# Patient Record
Sex: Male | Born: 1965
Health system: Southern US, Community
[De-identification: ages and names within clinical notes are randomized; demographics above are authoritative.]

## PROBLEM LIST (undated history)

## (undated) DIAGNOSIS — K200 Eosinophilic esophagitis: Secondary | ICD-10-CM

## (undated) DIAGNOSIS — J45909 Unspecified asthma, uncomplicated: Secondary | ICD-10-CM

## (undated) HISTORY — DX: Eosinophilic esophagitis: K20.0

## (undated) HISTORY — PX: WRIST SURGERY: SHX841

## (undated) HISTORY — DX: Unspecified asthma, uncomplicated: J45.909

---

## 2002-02-18 ENCOUNTER — Emergency Department (HOSPITAL_COMMUNITY): Admission: EM | Admit: 2002-02-18 | Discharge: 2002-02-19 | Payer: Self-pay | Admitting: Emergency Medicine

## 2002-02-19 ENCOUNTER — Encounter: Payer: Self-pay | Admitting: Emergency Medicine

## 2008-12-04 ENCOUNTER — Encounter: Admission: RE | Admit: 2008-12-04 | Discharge: 2008-12-04 | Payer: Self-pay | Admitting: Family Medicine

## 2010-01-31 ENCOUNTER — Encounter: Admission: RE | Admit: 2010-01-31 | Discharge: 2010-01-31 | Payer: Self-pay | Admitting: Family Medicine

## 2010-04-23 ENCOUNTER — Encounter: Admission: RE | Admit: 2010-04-23 | Discharge: 2010-04-23 | Payer: Self-pay | Admitting: Family Medicine

## 2010-04-24 ENCOUNTER — Encounter: Admission: RE | Admit: 2010-04-24 | Discharge: 2010-04-24 | Payer: Self-pay | Admitting: Family Medicine

## 2018-05-24 DIAGNOSIS — H40023 Open angle with borderline findings, high risk, bilateral: Secondary | ICD-10-CM | POA: Diagnosis not present

## 2018-05-24 DIAGNOSIS — H52223 Regular astigmatism, bilateral: Secondary | ICD-10-CM | POA: Diagnosis not present

## 2018-05-24 DIAGNOSIS — H5203 Hypermetropia, bilateral: Secondary | ICD-10-CM | POA: Diagnosis not present

## 2018-07-13 DIAGNOSIS — M62830 Muscle spasm of back: Secondary | ICD-10-CM | POA: Diagnosis not present

## 2018-07-13 DIAGNOSIS — S76012A Strain of muscle, fascia and tendon of left hip, initial encounter: Secondary | ICD-10-CM | POA: Diagnosis not present

## 2018-07-20 DIAGNOSIS — D225 Melanocytic nevi of trunk: Secondary | ICD-10-CM | POA: Diagnosis not present

## 2018-07-20 DIAGNOSIS — Z872 Personal history of diseases of the skin and subcutaneous tissue: Secondary | ICD-10-CM | POA: Diagnosis not present

## 2018-07-20 DIAGNOSIS — Z09 Encounter for follow-up examination after completed treatment for conditions other than malignant neoplasm: Secondary | ICD-10-CM | POA: Diagnosis not present

## 2018-08-26 DIAGNOSIS — H40023 Open angle with borderline findings, high risk, bilateral: Secondary | ICD-10-CM | POA: Diagnosis not present

## 2018-12-30 DIAGNOSIS — H53419 Scotoma involving central area, unspecified eye: Secondary | ICD-10-CM | POA: Diagnosis not present

## 2018-12-30 DIAGNOSIS — H40023 Open angle with borderline findings, high risk, bilateral: Secondary | ICD-10-CM | POA: Diagnosis not present

## 2019-02-23 ENCOUNTER — Emergency Department (HOSPITAL_COMMUNITY)
Admission: EM | Admit: 2019-02-23 | Discharge: 2019-02-23 | Disposition: A | Discharge status: Home / Self Care | Payer: BLUE CROSS/BLUE SHIELD | Attending: Emergency Medicine | Admitting: Emergency Medicine

## 2019-02-23 ENCOUNTER — Encounter (HOSPITAL_COMMUNITY): Payer: Self-pay

## 2019-02-23 ENCOUNTER — Emergency Department (HOSPITAL_COMMUNITY): Payer: BLUE CROSS/BLUE SHIELD

## 2019-02-23 DIAGNOSIS — S52612A Displaced fracture of left ulna styloid process, initial encounter for closed fracture: Secondary | ICD-10-CM | POA: Diagnosis not present

## 2019-02-23 DIAGNOSIS — S52572A Other intraarticular fracture of lower end of left radius, initial encounter for closed fracture: Secondary | ICD-10-CM

## 2019-02-23 DIAGNOSIS — S52515A Nondisplaced fracture of left radial styloid process, initial encounter for closed fracture: Secondary | ICD-10-CM | POA: Diagnosis not present

## 2019-02-23 DIAGNOSIS — Y9355 Activity, bike riding: Secondary | ICD-10-CM | POA: Diagnosis not present

## 2019-02-23 DIAGNOSIS — S52615A Nondisplaced fracture of left ulna styloid process, initial encounter for closed fracture: Secondary | ICD-10-CM | POA: Diagnosis not present

## 2019-02-23 DIAGNOSIS — S6992XA Unspecified injury of left wrist, hand and finger(s), initial encounter: Secondary | ICD-10-CM | POA: Diagnosis not present

## 2019-02-23 DIAGNOSIS — S52692A Other fracture of lower end of left ulna, initial encounter for closed fracture: Secondary | ICD-10-CM | POA: Diagnosis not present

## 2019-02-23 DIAGNOSIS — Y92482 Bike path as the place of occurrence of the external cause: Secondary | ICD-10-CM | POA: Insufficient documentation

## 2019-02-23 DIAGNOSIS — Y999 Unspecified external cause status: Secondary | ICD-10-CM | POA: Insufficient documentation

## 2019-02-23 DIAGNOSIS — S52592A Other fractures of lower end of left radius, initial encounter for closed fracture: Secondary | ICD-10-CM | POA: Diagnosis not present

## 2019-02-23 MED ORDER — HYDROCODONE-ACETAMINOPHEN 5-325 MG PO TABS: 1.0000 | ORAL_TABLET | Freq: Four times a day (QID) | ORAL | 0 refills | Status: DC | PRN

## 2019-02-23 NOTE — Discharge Instructions (Signed)
Call the office of Dr. Caralyn Guile in the morning to schedule follow-up tomorrow or Friday.  We recommend ibuprofen or Aleve for management of pain.  You may supplement this with Norco as needed for severe pain control.  Keep your splint on at all times.  Do not remove this and last till today so by an orthopedic hand surgeon.  Would benefit from keeping your left arm and hand elevated at or above the level of your heart to prevent increased pain and swelling.  Ice over top of your splint 3-4 times per day.  You may return to the ED for new or concerning symptoms.
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
## 2019-02-24 DIAGNOSIS — S52502A Unspecified fracture of the lower end of left radius, initial encounter for closed fracture: Secondary | ICD-10-CM | POA: Diagnosis not present

## 2019-02-28 DIAGNOSIS — S52571A Other intraarticular fracture of lower end of right radius, initial encounter for closed fracture: Secondary | ICD-10-CM | POA: Diagnosis not present

## 2019-02-28 DIAGNOSIS — S52592D Other fractures of lower end of left radius, subsequent encounter for closed fracture with routine healing: Secondary | ICD-10-CM | POA: Diagnosis not present

## 2019-03-15 DIAGNOSIS — S52502A Unspecified fracture of the lower end of left radius, initial encounter for closed fracture: Secondary | ICD-10-CM | POA: Diagnosis not present

## 2019-04-05 DIAGNOSIS — S52502A Unspecified fracture of the lower end of left radius, initial encounter for closed fracture: Secondary | ICD-10-CM | POA: Diagnosis not present

## 2019-04-05 DIAGNOSIS — M25632 Stiffness of left wrist, not elsewhere classified: Secondary | ICD-10-CM | POA: Diagnosis not present

## 2019-04-15 DIAGNOSIS — M25632 Stiffness of left wrist, not elsewhere classified: Secondary | ICD-10-CM | POA: Diagnosis not present

## 2019-04-19 DIAGNOSIS — M25632 Stiffness of left wrist, not elsewhere classified: Secondary | ICD-10-CM | POA: Diagnosis not present

## 2019-04-21 DIAGNOSIS — M25632 Stiffness of left wrist, not elsewhere classified: Secondary | ICD-10-CM | POA: Diagnosis not present

## 2019-04-26 DIAGNOSIS — S52502A Unspecified fracture of the lower end of left radius, initial encounter for closed fracture: Secondary | ICD-10-CM | POA: Diagnosis not present

## 2019-04-26 DIAGNOSIS — M25632 Stiffness of left wrist, not elsewhere classified: Secondary | ICD-10-CM | POA: Diagnosis not present

## 2019-04-28 DIAGNOSIS — M25632 Stiffness of left wrist, not elsewhere classified: Secondary | ICD-10-CM | POA: Diagnosis not present

## 2019-05-13 DIAGNOSIS — Z23 Encounter for immunization: Secondary | ICD-10-CM | POA: Diagnosis not present

## 2019-05-13 DIAGNOSIS — M25632 Stiffness of left wrist, not elsewhere classified: Secondary | ICD-10-CM | POA: Diagnosis not present

## 2019-05-18 DIAGNOSIS — M25632 Stiffness of left wrist, not elsewhere classified: Secondary | ICD-10-CM | POA: Diagnosis not present

## 2019-05-24 DIAGNOSIS — S52592D Other fractures of lower end of left radius, subsequent encounter for closed fracture with routine healing: Secondary | ICD-10-CM | POA: Diagnosis not present

## 2019-05-25 DIAGNOSIS — M25632 Stiffness of left wrist, not elsewhere classified: Secondary | ICD-10-CM | POA: Diagnosis not present

## 2019-05-30 DIAGNOSIS — H40023 Open angle with borderline findings, high risk, bilateral: Secondary | ICD-10-CM | POA: Diagnosis not present

## 2019-06-02 DIAGNOSIS — M25632 Stiffness of left wrist, not elsewhere classified: Secondary | ICD-10-CM | POA: Diagnosis not present

## 2019-06-02 DIAGNOSIS — Z0000 Encounter for general adult medical examination without abnormal findings: Secondary | ICD-10-CM | POA: Diagnosis not present

## 2019-06-07 DIAGNOSIS — Z125 Encounter for screening for malignant neoplasm of prostate: Secondary | ICD-10-CM | POA: Diagnosis not present

## 2019-06-07 DIAGNOSIS — Z8342 Family history of familial hypercholesterolemia: Secondary | ICD-10-CM | POA: Diagnosis not present

## 2019-06-20 DIAGNOSIS — M25632 Stiffness of left wrist, not elsewhere classified: Secondary | ICD-10-CM | POA: Diagnosis not present

## 2019-07-04 DIAGNOSIS — M25632 Stiffness of left wrist, not elsewhere classified: Secondary | ICD-10-CM | POA: Diagnosis not present

## 2019-07-22 DIAGNOSIS — L814 Other melanin hyperpigmentation: Secondary | ICD-10-CM | POA: Diagnosis not present

## 2019-07-22 DIAGNOSIS — D225 Melanocytic nevi of trunk: Secondary | ICD-10-CM | POA: Diagnosis not present

## 2019-07-22 DIAGNOSIS — D1801 Hemangioma of skin and subcutaneous tissue: Secondary | ICD-10-CM | POA: Diagnosis not present

## 2019-07-22 DIAGNOSIS — L821 Other seborrheic keratosis: Secondary | ICD-10-CM | POA: Diagnosis not present

## 2019-08-01 DIAGNOSIS — M25632 Stiffness of left wrist, not elsewhere classified: Secondary | ICD-10-CM | POA: Diagnosis not present

## 2019-08-05 ENCOUNTER — Other Ambulatory Visit: Payer: Self-pay

## 2019-08-05 ENCOUNTER — Emergency Department (HOSPITAL_COMMUNITY)
Admission: EM | Admit: 2019-08-05 | Discharge: 2019-08-06 | Disposition: A | Discharge status: Home / Self Care | Payer: BC Managed Care – PPO | Attending: Emergency Medicine | Admitting: Emergency Medicine

## 2019-08-05 ENCOUNTER — Encounter (HOSPITAL_COMMUNITY): Payer: Self-pay | Admitting: Emergency Medicine

## 2019-08-05 DIAGNOSIS — N200 Calculus of kidney: Secondary | ICD-10-CM

## 2019-08-05 DIAGNOSIS — R112 Nausea with vomiting, unspecified: Secondary | ICD-10-CM | POA: Diagnosis not present

## 2019-08-05 DIAGNOSIS — R911 Solitary pulmonary nodule: Secondary | ICD-10-CM | POA: Insufficient documentation

## 2019-08-05 DIAGNOSIS — R109 Unspecified abdominal pain: Secondary | ICD-10-CM | POA: Diagnosis not present

## 2019-08-05 DIAGNOSIS — R1031 Right lower quadrant pain: Secondary | ICD-10-CM | POA: Diagnosis not present

## 2019-08-05 DIAGNOSIS — R0781 Pleurodynia: Secondary | ICD-10-CM | POA: Diagnosis not present

## 2019-08-05 LAB — COMPREHENSIVE METABOLIC PANEL
ALT: 19 U/L (ref 0–44)
AST: 31 U/L (ref 15–41)
Albumin: 4.3 g/dL (ref 3.5–5.0)
Alkaline Phosphatase: 49 U/L (ref 38–126)
Anion gap: 11 (ref 5–15)
BUN: 16 mg/dL (ref 6–20)
CO2: 24 mmol/L (ref 22–32)
Calcium: 9.1 mg/dL (ref 8.9–10.3)
Chloride: 101 mmol/L (ref 98–111)
Creatinine, Ser: 1.19 mg/dL (ref 0.61–1.24)
GFR calc Af Amer: >60 mL/min (ref >60)
GFR calc non Af Amer: >60 mL/min (ref >60)
Glucose, Bld: 138 mg/dL — ABNORMAL HIGH (ref 70–99)
Potassium: 3.9 mmol/L (ref 3.5–5.1)
Sodium: 136 mmol/L (ref 135–145)
Total Bilirubin: 0.6 mg/dL (ref 0.3–1.2)
Total Protein: 6.7 g/dL (ref 6.5–8.1)

## 2019-08-05 LAB — CBC
HCT: 45.7 % (ref 39.0–52.0)
Hemoglobin: 15.2 g/dL (ref 13.0–17.0)
MCH: 30.3 pg (ref 26.0–34.0)
MCHC: 33.3 g/dL (ref 30.0–36.0)
MCV: 91 fL (ref 80.0–100.0)
Platelets: 257 10*3/uL (ref 150–400)
RBC: 5.02 MIL/uL (ref 4.22–5.81)
RDW: 12 % (ref 11.5–15.5)
WBC: 7 10*3/uL (ref 4.0–10.5)
nRBC: 0 % (ref 0.0–0.2)

## 2019-08-05 LAB — LIPASE, BLOOD: Lipase: 48 U/L (ref 11–51)

## 2019-08-05 MED ORDER — SODIUM CHLORIDE 0.9% FLUSH
3.0000 mL | Freq: Once | INTRAVENOUS | Status: AC
  Administered 2019-08-06: 3 mL via INTRAVENOUS

## 2019-08-05 NOTE — ED Triage Notes (Signed)
Patient reports RLQ pain with emesis x1 onset 7 pm this evening , no diarrhea or fever .

## 2019-08-06 ENCOUNTER — Emergency Department (HOSPITAL_COMMUNITY): Payer: BC Managed Care – PPO

## 2019-08-06 DIAGNOSIS — R109 Unspecified abdominal pain: Secondary | ICD-10-CM | POA: Diagnosis not present

## 2019-08-06 LAB — CBC WITH DIFFERENTIAL/PLATELET
Abs Immature Granulocytes: 0.03 10*3/uL (ref 0.00–0.07)
Basophils Absolute: 0 10*3/uL (ref 0.0–0.1)
Basophils Relative: 0 %
Eosinophils Absolute: 0.1 10*3/uL (ref 0.0–0.5)
Eosinophils Relative: 1 %
HCT: 43.8 % (ref 39.0–52.0)
Hemoglobin: 14.8 g/dL (ref 13.0–17.0)
Immature Granulocytes: 0 %
Lymphocytes Relative: 6 %
Lymphs Abs: 0.6 10*3/uL — ABNORMAL LOW (ref 0.7–4.0)
MCH: 30.5 pg (ref 26.0–34.0)
MCHC: 33.8 g/dL (ref 30.0–36.0)
MCV: 90.3 fL (ref 80.0–100.0)
Monocytes Absolute: 0.6 10*3/uL (ref 0.1–1.0)
Monocytes Relative: 7 %
Neutro Abs: 7.9 10*3/uL — ABNORMAL HIGH (ref 1.7–7.7)
Neutrophils Relative %: 86 %
Platelets: 234 10*3/uL (ref 150–400)
RBC: 4.85 MIL/uL (ref 4.22–5.81)
RDW: 12.1 % (ref 11.5–15.5)
WBC: 9.2 10*3/uL (ref 4.0–10.5)
nRBC: 0 % (ref 0.0–0.2)

## 2019-08-06 LAB — COMPREHENSIVE METABOLIC PANEL
ALT: 20 U/L (ref 0–44)
AST: 27 U/L (ref 15–41)
Albumin: 4.2 g/dL (ref 3.5–5.0)
Alkaline Phosphatase: 47 U/L (ref 38–126)
Anion gap: 9 (ref 5–15)
BUN: 15 mg/dL (ref 6–20)
CO2: 25 mmol/L (ref 22–32)
Calcium: 9 mg/dL (ref 8.9–10.3)
Chloride: 104 mmol/L (ref 98–111)
Creatinine, Ser: 1.2 mg/dL (ref 0.61–1.24)
GFR calc Af Amer: >60 mL/min (ref >60)
GFR calc non Af Amer: >60 mL/min (ref >60)
Glucose, Bld: 121 mg/dL — ABNORMAL HIGH (ref 70–99)
Potassium: 3.7 mmol/L (ref 3.5–5.1)
Sodium: 138 mmol/L (ref 135–145)
Total Bilirubin: 0.9 mg/dL (ref 0.3–1.2)
Total Protein: 6.5 g/dL (ref 6.5–8.1)

## 2019-08-06 LAB — URINALYSIS, ROUTINE W REFLEX MICROSCOPIC
Bacteria, UA: NONE SEEN
Bilirubin Urine: NEGATIVE
Glucose, UA: NEGATIVE mg/dL
Ketones, ur: 20 mg/dL — AB
Leukocytes,Ua: NEGATIVE
Nitrite: NEGATIVE
Protein, ur: NEGATIVE mg/dL
RBC / HPF: >50 RBC/hpf — ABNORMAL HIGH (ref 0–5)
Specific Gravity, Urine: 1.02 (ref 1.005–1.030)
pH: 6 (ref 5.0–8.0)

## 2019-08-06 MED ORDER — HYDROCODONE-ACETAMINOPHEN 5-325 MG PO TABS: 1.0000 | ORAL_TABLET | ORAL | 0 refills | Status: DC | PRN

## 2019-08-06 MED ORDER — KETOROLAC TROMETHAMINE 30 MG/ML IJ SOLN
30.0000 mg | Freq: Once | INTRAMUSCULAR | Status: AC
  Administered 2019-08-06: 11:00:00 30 mg via INTRAVENOUS
  Filled 2019-08-06: qty 1

## 2019-08-06 MED ORDER — IOHEXOL 300 MG/ML  SOLN
100.0000 mL | Freq: Once | INTRAMUSCULAR | Status: AC | PRN
  Administered 2019-08-06: 100 mL via INTRAVENOUS

## 2019-08-06 MED ORDER — ONDANSETRON HCL 4 MG/2ML IJ SOLN
4.0000 mg | Freq: Once | INTRAMUSCULAR | Status: AC
  Administered 2019-08-06: 09:00:00 4 mg via INTRAVENOUS
  Filled 2019-08-06: qty 2

## 2019-08-06 MED ORDER — ONDANSETRON 4 MG PO TBDP: 4.0000 mg | ORAL_TABLET | Freq: Three times a day (TID) | ORAL | 0 refills | Status: DC | PRN

## 2019-08-06 MED ORDER — MORPHINE SULFATE (PF) 4 MG/ML IV SOLN
4.0000 mg | Freq: Once | INTRAVENOUS | Status: AC
  Administered 2019-08-06: 4 mg via INTRAVENOUS
  Filled 2019-08-06: qty 1

## 2019-08-06 MED ORDER — SODIUM CHLORIDE 0.9 % IV BOLUS
1000.0000 mL | Freq: Once | INTRAVENOUS | Status: AC
  Administered 2019-08-06: 09:00:00 1000 mL via INTRAVENOUS

## 2019-08-06 NOTE — ED Notes (Signed)
Patient transported to CT 

## 2019-08-06 NOTE — ED Notes (Signed)
Pt ambulates with steady gait, reports family is on the way to pick him up.

## 2019-08-06 NOTE — Discharge Instructions (Addendum)
Follow-up with your primary care provider to review your results for today.  Incidental finding of pulmonary nodules while recommend repeat CT scan per guidelines.  Prescriptions for Norco and Zofran have been sent to your pharmacy, if your pain returns you may take these as prescribed, do not drive or operate machinery if taking Norco.  Return to the ER for worsening pain, fever, vomiting, symptoms not controlled by her medications.

## 2019-08-06 NOTE — ED Provider Notes (Signed)
[facility] EMERGENCY DEPARTMENT Provider Note   CSN: [id number] Arrival date & time: 08/05/19  2205     History Chief Complaint  Patient presents with  . Abdominal Pain    Keith Carlson is a 54 y.o. male.  54 year old male with no significant past medical history presents with complaint of right lower abdominal pain.  Patient states pain started under his right ribs about 13 hours ago after he finished eating dinner.  Pain was intermittent, has been constant since 630 this morning, radiating from under his ribs to right mid to lower abdominal area.  Nothing makes pain better or worse.  Pain is associated with one episode of nausea and vomiting, denies changes in bowel or bladder habits, fever, chills.  No prior abdominal surgeries.  Patient states he was feeling well yesterday until this pain started, no history of gallbladder problems or pain related to food intake.  No other complaints or concerns.        History reviewed. No pertinent past medical history.  There are no problems to display for this patient.   Past Surgical History:  Procedure Laterality Date  . WRIST SURGERY         No family history on file.  Social History   Tobacco Use  . Smoking status: Never Smoker  . Smokeless tobacco: Never Used  Substance Use Topics  . Alcohol use: Yes  . Drug use: Never    Home Medications Prior to Admission medications   Medication Sig Start Date End Date Taking? Authorizing Provider  HYDROcodone-acetaminophen (NORCO/VICODIN) 5-325 MG tablet Take 1 tablet by mouth every 4 (four) hours as needed. 08/06/19   Jeannie Fend, PA-C  ondansetron (ZOFRAN ODT) 4 MG disintegrating tablet Take 1 tablet (4 mg total) by mouth every 8 (eight) hours as needed for nausea or vomiting. 08/06/19   Jeannie Fend, PA-C    Allergies    Patient has no known allergies.  Review of Systems   Review of Systems  Constitutional: Negative for chills, diaphoresis and  fever.  Cardiovascular: Negative for chest pain.  Gastrointestinal: Positive for abdominal pain, nausea and vomiting. Negative for constipation and diarrhea.  Genitourinary: Negative for difficulty urinating, dysuria and frequency.  Musculoskeletal: Negative for arthralgias, back pain and myalgias.  Skin: Negative for rash and wound.  Allergic/Immunologic: Negative for immunocompromised state.  Neurological: Negative for weakness.  Hematological: Negative for adenopathy.  Psychiatric/Behavioral: Negative for confusion.  All other systems reviewed and are negative.   Physical Exam Updated Vital Signs BP 132/80   Pulse 64   Temp 97.6 F (36.4 C) (Oral)   Resp 16   SpO2 99%   Physical Exam Vitals and nursing note reviewed.  Constitutional:      General: He is not in acute distress.    Appearance: He is well-developed. He is not diaphoretic.  HENT:     Head: Normocephalic and atraumatic.  Cardiovascular:     Rate and Rhythm: Normal rate and regular rhythm.     Heart sounds: Normal heart sounds.  Pulmonary:     Effort: Pulmonary effort is normal.  Abdominal:     General: Abdomen is flat.     Palpations: Abdomen is soft.     Tenderness: There is abdominal tenderness in the right lower quadrant. There is guarding. There is no right CVA tenderness or left CVA tenderness. Positive signs include Rovsing's sign. Negative signs include Avah Bashor's sign, psoas sign and obturator sign.  Skin:  General: Skin is warm and dry.     Findings: No erythema or rash.  Neurological:     Mental Status: He is alert and oriented to person, place, and time.  Psychiatric:        Behavior: Behavior normal.     ED Results / Procedures / Treatments   Labs (all labs ordered are listed, but only abnormal results are displayed) Labs Reviewed  COMPREHENSIVE METABOLIC PANEL - Abnormal; Notable for the following components:      Result Value   Glucose, Bld 138 (*)    All other components within  normal limits  URINALYSIS, ROUTINE W REFLEX MICROSCOPIC - Abnormal; Notable for the following components:   APPearance HAZY (*)    Hgb urine dipstick LARGE (*)    Ketones, ur 20 (*)    RBC / HPF >50 (*)    All other components within normal limits  COMPREHENSIVE METABOLIC PANEL - Abnormal; Notable for the following components:   Glucose, Bld 121 (*)    All other components within normal limits  CBC WITH DIFFERENTIAL/PLATELET - Abnormal; Notable for the following components:   Neutro Abs 7.9 (*)    Lymphs Abs 0.6 (*)    All other components within normal limits  LIPASE, BLOOD  CBC    EKG None  Radiology CT Abdomen Pelvis W Contrast  Result Date: 08/06/2019 CLINICAL DATA:  Right lower quadrant abdominal pain. Evaluate for appendicitis. EXAM: CT ABDOMEN AND PELVIS WITH CONTRAST TECHNIQUE: Multidetector CT imaging of the abdomen and pelvis was performed using the standard protocol following bolus administration of intravenous contrast. CONTRAST:  134mL OMNIPAQUE IOHEXOL 300 MG/ML  SOLN COMPARISON:  None. FINDINGS: Lower chest: Limited visualization of the lower thorax demonstrates an approximately 0.7 cm nodule within the left lower lobe (image 4, series 4) which contains an internal calcification (coronal image 101, series 6). There is an approximately 0.7 cm noncalcified nodule within the right lower lobe (axial image 3, series 4; coronal image 69, series 6) as well as a punctate (approximately 4 mm) left lower lobe pulmonary nodule (image 99, series 6) as well as a 3 mm pulmonary nodule within the right lower lobe (image 1, series 4). Minimal dependent subpleural ground-glass atelectasis. Minimal subsegmental atelectasis within the left costophrenic angle. No discrete consolidative opacities. No air bronchograms. No pleural effusion. Normal heart size.  No pericardial effusion. Hepatobiliary: Normal hepatic contour. There is a minimal amount of focal fatty infiltration adjacent to the fissure  for ligamentum teres. No discrete hepatic lesions. Normal appearance of the gallbladder given degree distention. No radiopaque gallstones. No intra or extrahepatic biliary ductal dilatation. No ascites. Pancreas: Normal appearance of the pancreas. Spleen: Normal appearance of the spleen. Adrenals/Urinary Tract: There is a punctate (approximately 2 mm) nonobstructing stone lying dependently within the urinary bladder adjacent to the right UVJ (image 82, series 3; sagittal image 79, series 7) with associated mild residual upstream right-sided ureterectasis and pelvicaliectasis and thus favored to represent a recently passed right-sided ureteral stone. Note is made of a punctate (approximately 2 mm) nonobstructing stone within the inferior pole the right kidney (coronal image 88, series 6. No evidence of left-sided nephrolithiasis on this postcontrast examination. No evidence of left-sided urinary obstruction. Several phleboliths are seen with the lower pelvis bilaterally, left greater than right. Punctate subcentimeter bilateral renal lesions are too small to accurately characterize though favored to represent renal cysts. Normal appearance the bilateral adrenal glands. Otherwise, normal appearance of the urinary bladder given degree of distention.  Stomach/Bowel: Moderate colonic stool burden without evidence of enteric obstruction. Normal appearance of the terminal ileum. The appendix is not visualized, however there is no pericecal inflammatory change. No discrete areas of bowel wall thickening. No pneumoperitoneum, pneumatosis or portal venous gas. Vascular/Lymphatic: Normal caliber the abdominal aorta. The major branch vessels of the abdominal aorta appear patent on this non CTA examination. No bulky retroperitoneal, mesenteric, pelvic or inguinal lymph adenopathy. Reproductive: Normal appearance the prostate gland. No free fluid within the pelvic cul-de-sac. Other: Regional soft tissues appear normal.  Musculoskeletal: No acute or aggressive osseous abnormalities. Moderate to severe multilevel lumbar spine DDD, worse at L3-L4, how L4-L5 and to a lesser extent, L5-S1 with disc space height loss, endplate irregularity and sclerosis. Ex vacuo disc phenomenon is noted at multiple levels. IMPRESSION: 1. Punctate (approximately 2 mm) stone within the urinary bladder adjacent to the right UVJ with residual upstream mild right-sided ureterectasis and pelvicaliectasis, favored to represent a recently passed right-sided ureteral stone. 2. Solitary additional punctate (approximately 2 mm) nonobstructing right-sided renal stone. 3. No evidence of left sided nephrolithiasis or urinary obstruction. 4. Indeterminate pulmonary nodules within the imaged lung bases, the largest of which measures 7 mm in diameter. Comparison with prior outside examinations (if available), is advised. Otherwise, a non-contrast chest CT at 3-6 months is recommended. If the nodules are stable at time of repeat CT, then future CT at 18-24 months (from today's scan) is considered optional for low-risk patients, but is recommended for high-risk patients. This recommendation follows the consensus statement: Guidelines for Management of Incidental Pulmonary Nodules Detected on CT Images: From the Fleischner Society 2017; Radiology 2017; 284:228-243. Electronically Signed   By: Simonne Come M.D.   On: 08/06/2019 09:59    Procedures Procedures (including critical care time)  Medications Ordered in ED Medications  sodium chloride flush (NS) 0.9 % injection 3 mL (has no administration in time range)  ketorolac (TORADOL) 30 MG/ML injection 30 mg (has no administration in time range)  sodium chloride 0.9 % bolus 1,000 mL (1,000 mLs Intravenous New Bag/Given 08/06/19 0852)  ondansetron (ZOFRAN) injection 4 mg (4 mg Intravenous Given 08/06/19 0853)  morphine 4 MG/ML injection 4 mg (4 mg Intravenous Given 08/06/19 0854)  iohexol (OMNIPAQUE) 300 MG/ML solution  100 mL (100 mLs Intravenous Contrast Given 08/06/19 0930)    ED Course  I have reviewed the triage vital signs and the nursing notes.  Pertinent labs & imaging results that were available during my care of the patient were reviewed by me and considered in my medical decision making (see chart for details).  Clinical Course as of Aug 05 1029  Sat Aug 06, 2019  5841 54 year old male presents with complaint of right side abdominal pain onset around 7 PM after eating dinner.  Pain was intermittent and then became constant this morning.  On exam patient had right mid abdominal tenderness with positive Rovsing sign, negative obturator and psoas, negative Arnella Pralle.  Slight guarding.  Patient was concern for possible appendicitis due to family history of same.  Initial blood work was drawn on arrival and near onset of patient's pain, he had a an extensive wait in the lobby today, labs were repeated to evaluate for any changes.  No significant changes to CBC and CMP.  Urinalysis with large amount hemoglobin, ketones, red blood cells, suspect kidney stone.  CT abdomen and pelvis with contrast does not show the appendix however no signs of acute appendicitis.  There is a small stone in the  bladder with evidence of recent passage of right-sided ureterolithiasis.  Patient does have a remaining small stone in the right kidney.  Incidental finding of pulmonary nodules.  CT results, lab results discussed in entirety with patient, given a copy to take to PCP for follow-up.  Recommend follow-up CT scan chest per guidelines for finding of pulmonary nodules.  Patient states his pain has resolved, is concerned that his pain may return.  He will be given a dose of Toradol while in the ER, given prescriptions for Norco and Zofran to take if his symptoms return with strict return precautions and plan for follow-up with PCP.  Patient verbalizes understanding discharge instructions and plan.   [LM]    Clinical Course User  Index [LM] Alden Hipp   MDM Rules/Calculators/A&P                      Final Clinical Impression(s) / ED Diagnoses Final diagnoses:  Kidney stone  Incidental pulmonary nodule    Rx / DC Orders ED Discharge Orders         Ordered    HYDROcodone-acetaminophen (NORCO/VICODIN) 5-325 MG tablet  Every 4 hours PRN     08/06/19 1027    ondansetron (ZOFRAN ODT) 4 MG disintegrating tablet  Every 8 hours PRN     08/06/19 1027           Jeannie Fend, PA-C 08/06/19 1031    Virgina Norfolk, DO 08/06/19 1341

## 2019-08-15 DIAGNOSIS — R911 Solitary pulmonary nodule: Secondary | ICD-10-CM | POA: Diagnosis not present

## 2019-08-15 DIAGNOSIS — N200 Calculus of kidney: Secondary | ICD-10-CM | POA: Diagnosis not present

## 2019-11-28 DIAGNOSIS — H40013 Open angle with borderline findings, low risk, bilateral: Secondary | ICD-10-CM | POA: Diagnosis not present

## 2019-11-28 DIAGNOSIS — H524 Presbyopia: Secondary | ICD-10-CM | POA: Diagnosis not present

## 2019-11-28 DIAGNOSIS — H52223 Regular astigmatism, bilateral: Secondary | ICD-10-CM | POA: Diagnosis not present

## 2019-11-28 DIAGNOSIS — H5203 Hypermetropia, bilateral: Secondary | ICD-10-CM | POA: Diagnosis not present

## 2020-02-13 ENCOUNTER — Other Ambulatory Visit: Payer: Self-pay | Admitting: Family Medicine

## 2020-02-13 DIAGNOSIS — R918 Other nonspecific abnormal finding of lung field: Secondary | ICD-10-CM

## 2020-02-23 ENCOUNTER — Ambulatory Visit
Admission: RE | Admit: 2020-02-23 | Discharge: 2020-02-23 | Disposition: A | Discharge status: Home / Self Care | Payer: BC Managed Care – PPO | Source: Ambulatory Visit | Attending: Family Medicine | Admitting: Family Medicine

## 2020-02-23 DIAGNOSIS — R918 Other nonspecific abnormal finding of lung field: Secondary | ICD-10-CM

## 2020-03-15 ENCOUNTER — Ambulatory Visit (INDEPENDENT_AMBULATORY_CARE_PROVIDER_SITE_OTHER): Payer: BC Managed Care – PPO | Admitting: Pulmonary Disease

## 2020-03-15 ENCOUNTER — Other Ambulatory Visit: Payer: Self-pay

## 2020-03-15 ENCOUNTER — Encounter: Payer: Self-pay | Admitting: Pulmonary Disease

## 2020-03-15 VITALS — BP 124/76 | HR 58 | Temp 97.1°F | Ht 74.0 in | Wt 181.0 lb

## 2020-03-15 DIAGNOSIS — J302 Other seasonal allergic rhinitis: Secondary | ICD-10-CM | POA: Diagnosis not present

## 2020-03-15 DIAGNOSIS — J479 Bronchiectasis, uncomplicated: Secondary | ICD-10-CM | POA: Diagnosis not present

## 2020-03-15 DIAGNOSIS — R9389 Abnormal findings on diagnostic imaging of other specified body structures: Secondary | ICD-10-CM

## 2020-03-15 LAB — CBC WITH DIFFERENTIAL/PLATELET
Basophils Absolute: 0.1 10*3/uL (ref 0.0–0.1)
Basophils Relative: 1.4 % (ref 0.0–3.0)
Eosinophils Absolute: 0.3 10*3/uL (ref 0.0–0.7)
Eosinophils Relative: 7.8 % — ABNORMAL HIGH (ref 0.0–5.0)
HCT: 45.5 % (ref 39.0–52.0)
Hemoglobin: 15.2 g/dL (ref 13.0–17.0)
Lymphocytes Relative: 23.6 % (ref 12.0–46.0)
Lymphs Abs: 1.1 10*3/uL (ref 0.7–4.0)
MCHC: 33.5 g/dL (ref 30.0–36.0)
MCV: 93.9 fl (ref 78.0–100.0)
Monocytes Absolute: 0.4 10*3/uL (ref 0.1–1.0)
Monocytes Relative: 9.7 % (ref 3.0–12.0)
Neutro Abs: 2.6 10*3/uL (ref 1.4–7.7)
Neutrophils Relative %: 57.5 % (ref 43.0–77.0)
Platelets: 228 10*3/uL (ref 150.0–400.0)
RBC: 4.84 Mil/uL (ref 4.22–5.81)
RDW: 13.5 % (ref 11.5–15.5)
WBC: 4.5 10*3/uL (ref 4.0–10.5)

## 2020-03-15 LAB — C-REACTIVE PROTEIN: CRP: <1 mg/dL (ref 0.5–20.0)

## 2020-03-15 LAB — SEDIMENTATION RATE: Sed Rate: 1 mm/hr (ref 0–20)

## 2020-03-15 NOTE — Progress Notes (Signed)
Patient ID: WESTEN DININO, male    DOB: 1966-05-20, 54 y.o.   MRN: [id number]  Chief Complaint  Patient presents with  . Consult    Referred by PCP for abnormal CT.  pt denies any breathing complaints.      Referring provider: Darrow Bussing, MD  HPI:  Mr. Tarbet is a 54 year old man with PMH eosinophilic esophagitis and seasonal allergies whom we are seeing in consultation at the request of Hibas Koirala, MD for evaluation of tree in bud opacities. Notes from referring provider reviewed.   Patient feels well. He has no cough. He has no SOB or dyspnea. He is an avid exerciser. Rode his bike 20 miles 2 days ago. No history of exercise limitation.  He had a CT abd/plevis 08/2019 for kidney stones that demonstrated  small <5mm nodules and on my interpretation subtle very mild bronchiectasis. CT chest for f/u of nodules was obtained 02/25/2020 that showed upper lobe predominant diffuse centrally located tree in bud opacities with scattered and relatively uniform mild bronchiectasis.   He notes dysphagia in the past hat led to endoscopy 8 years or so ago. This demonstrated a stricture that was dilated. Biopsies showed eosinophilic esophagitis. He took steroids per GI (inhlaed mixed with water swallowed) and developed pneumonia shortly thereafter so he stopped them. He was advised about 3 years ago to take PPI but developed diarrhea so stopped the PPI. Prior to the dilation he would have intense reflux and heartburn. He denies significant issues with reflux for at least the last year. No coughing with eating or drinking. No cough at night or lying supine or choking sensation.   Has a lot of seasonal allergies well controlled with antihistamine. No exertional cough. Never diagnosed or treated for asthma. No birds. Has cat he reports allergic to but washes hands after touching and has no issues. Moved in to new house 1 year ago. Same job.   PMH: Eosinophilic esophagitis Surgical History:  EGD Family History: no h/o lung cancer or lung disease Social History: Lives in [place], Web designer / Pulmonary Flowsheets:   ACT:  No flowsheet data found.  MMRC: No flowsheet data found.  Epworth:  No flowsheet data found.  Tests:   FENO:  No results found for: NITRICOXIDE  PFT: No flowsheet data found.  WALK:  No flowsheet data found.  Imaging: CT CHEST WO CONTRAST  Result Date: 02/25/2020 CLINICAL DATA:  Six-month follow-up of pulmonary nodules. EXAM: CT CHEST WITHOUT CONTRAST TECHNIQUE: Multidetector CT imaging of the chest was performed following the standard protocol without IV contrast. COMPARISON:  CT of the abdomen and pelvis on 08/06/2019 FINDINGS: Cardiovascular: Heart size normal. No significant coronary artery calcifications. No significant atherosclerotic calcification of the thoracic aorta. The noncontrast appearance of the pulmonary arteries is unremarkable. Mediastinum/Nodes: A low-attenuation circumscribed nodule is identified in the RIGHT lobe of the thyroid and measures 1.1 x 0.7 centimeters. Esophagus is unremarkable. No significant mediastinal or hilar adenopathy. Lungs/Pleura: A 6 millimeter nodule is identified within the RIGHT LOWER lobe, best seen on image 135 of series 8. The nodule is contiguous with pulmonary veins and is consistent small pulmonary vein varix. Additional varices are identified in the RIGHT LOWER lobe on image 135 of series 8, 129 of series 8, and 113 of series 8. A LEFT LOWER lobe pulmonary vein varix is best seen on image 129 of series 8. RIGHT middle lobe pulmonary vein varix is seen on image III series 8.  There are focal areas of pleural thickening, primarily within the LEFT LOWER lobe. There are numerous tree-in-bud type opacities primarily involving the central portions of all lobes, consistent with infectious or inflammatory process. These changes were not identified on the previous exam the previous exam  was limited to the lung bases. Therefore, chronicity cannot be assessed. There are no areas of consolidation. No pleural effusions. No evidence for pulmonary edema. Upper Abdomen: No acute abnormality. Musculoskeletal: No chest wall mass or suspicious bone lesions identified. IMPRESSION: 1. Significant, diffuse tree-in-bud type opacities primarily involving the central portions of all lobes, consistent with infectious or inflammatory process. 2. Small pulmonary vein varices within the RIGHT LOWER lobe, LEFT LOWER lobe, and RIGHT middle lobe. No suspicious pulmonary nodules. 3. 1.1 x 0.7 centimeters low-attenuation nodule in the RIGHT lobe of the thyroid gland. Nodule is not clinically significant; no follow-up imaging recommended (ref: J Am Coll Radiol. 2015 Feb;12(2): 143-50). Electronically Signed   By: Norva Pavlov M.D.   On: 02/25/2020 11:05    Lab Results: Reviewed, Eos 100 CBC    Component Value Date/Time   WBC 9.2 08/06/2019 0848   RBC 4.85 08/06/2019 0848   HGB 14.8 08/06/2019 0848   HCT 43.8 08/06/2019 0848   PLT 234 08/06/2019 0848   MCV 90.3 08/06/2019 0848   MCH 30.5 08/06/2019 0848   MCHC 33.8 08/06/2019 0848   RDW 12.1 08/06/2019 0848   LYMPHSABS 0.6 (L) 08/06/2019 0848   MONOABS 0.6 08/06/2019 0848   EOSABS 0.1 08/06/2019 0848   BASOSABS 0.0 08/06/2019 0848    BMET    Component Value Date/Time   NA 138 08/06/2019 0848   K 3.7 08/06/2019 0848   CL 104 08/06/2019 0848   CO2 25 08/06/2019 0848   GLUCOSE 121 (H) 08/06/2019 0848   BUN 15 08/06/2019 0848   CREATININE 1.20 08/06/2019 0848   CALCIUM 9.0 08/06/2019 0848   GFRNONAA >60 08/06/2019 0848   GFRAA >60 08/06/2019 0848    BNP No results found for: BNP  ProBNP No results found for: PROBNP  Specialty Problems    None      No Known Allergies  Immunization History  Administered Date(s) Administered  . Influenza Split 05/16/2019  . PFIZER SARS-COV-2 Vaccination 09/05/2019, 09/26/2019    Past  Medical History:  Diagnosis Date  . Asthma due to environmental allergies   . Eosinophilic esophagitis     Tobacco History: Social History   Tobacco Use  Smoking Status Passive Smoke Exposure - Never Smoker  Smokeless Tobacco Never Used  Tobacco Comment   father smoked in home growing up.    Counseling given: Not Answered Comment: father smoked in home growing up.    Continue to not smoke  Outpatient Encounter Medications as of 03/15/2020  Medication Sig  . loratadine (CLARITIN) 10 MG tablet Take 10 mg by mouth daily.  . Multiple Vitamin (MULTIVITAMIN WITH MINERALS) TABS tablet Take 1 tablet by mouth daily.  . [DISCONTINUED] HYDROcodone-acetaminophen (NORCO/VICODIN) 5-325 MG tablet Take 1 tablet by mouth every 4 (four) hours as needed.  . [DISCONTINUED] ondansetron (ZOFRAN ODT) 4 MG disintegrating tablet Take 1 tablet (4 mg total) by mouth every 8 (eight) hours as needed for nausea or vomiting.   No facility-administered encounter medications on file as of 03/15/2020.     Review of Systems  Review of Systems  No chest pain, No orthopnea or PND, No LE edema. Comprehensive review os systems otherwise negative.  Physical Exam  BP 124/76 (BP  Location: Right Arm, Cuff Size: Normal)   Pulse (!) 58   Temp (!) 97.1 F (36.2 C) (Temporal)   Ht 6\' 2"  (1.88 m)   Wt 181 lb (82.1 kg)   SpO2 100%   BMI 23.24 kg/m   Wt Readings from Last 5 Encounters:  03/15/20 181 lb (82.1 kg)    BMI Readings from Last 5 Encounters:  03/15/20 23.24 kg/m     Physical Exam General: tall, thin, in NAD Eyes: EOMI, no icterus Neck: supple, no JVP upright Respiratory: NWOB, CTAB, no crackles Cardiovascular: RRR, no murmurs Abdomen/GI: BS present, non-tender MSK: No joint effusions, no synovitis Neuro: Normal gait, no weakness Psych: Normal mood, full affect   Assessment & Plan:   Mr. Chalfant is a 54 year old man with PMH eosinophilic esophagitis and seasonal allergies whom we are  seeing in consultation at the request of Hibas Koirala, MD for evaluation of tree in bud opacities. Notes from referring provider reviewed.   Diffuse central upper lobe predominant tree in bud nodules are non-specific. Could be related to sarcoid or HSP but no cough or dyspnea. Asthma possible given atopy bit again no symptoms. He has h/o dysphagia and GERD and central bronchiectasis with tree in bud could represent chronic aspiration changes. He has no current GERD symptoms and lack of basilar tree in bud opacities would be unusual. Overall, suspect chronic changes from aspiration. Will obtain lab work to evaluate for inflammatory etiologies and HSP panel as possible cause of imaging findings. No symptoms at this time so no rush for treatment. Consider HTS given bronchiectasis if work up is unrevealing.    No follow-ups on file.   40, MD 03/15/2020

## 2020-03-15 NOTE — Patient Instructions (Signed)
Nice to meet you!  We will get blood work today to see if any thing points [place] in a direction as to why you have these "tree in bud" opacities in your lungs.  The good news is you have no cough and no exercise limitation so we have time to try to figure out what this is. We will develop a game pan for what to do if symptoms arise.   Follow up TBD after lab results.

## 2020-03-23 LAB — HYPERSENSITIVITY PNUEMONITIS PROFILE
ASPERGILLUS FUMIGATUS: NEGATIVE
Faenia retivirgula: NEGATIVE
Pigeon Serum: NEGATIVE
S. VIRIDIS: NEGATIVE
T. CANDIDUS: NEGATIVE
T. VULGARIS: NEGATIVE

## 2020-03-23 LAB — VITAMIN D 1,25 DIHYDROXY
Vitamin D 1, 25 (OH)2 Total: 41 pg/mL (ref 18–72)
Vitamin D2 1, 25 (OH)2: <8 pg/mL
Vitamin D3 1, 25 (OH)2: 41 pg/mL

## 2020-03-23 LAB — ALPHA-1 ANTITRYPSIN PHENOTYPE: A-1 Antitrypsin, Ser: 141 mg/dL (ref 83–199)

## 2020-03-23 LAB — IGE: IgE (Immunoglobulin E), Serum: 75 kU/L (ref <=114)

## 2020-03-23 LAB — ANGIOTENSIN CONVERTING ENZYME: Angiotensin-Converting Enzyme: 33 U/L (ref 9–67)

## 2020-04-17 DIAGNOSIS — Z23 Encounter for immunization: Secondary | ICD-10-CM | POA: Diagnosis not present

## 2020-05-19 IMAGING — CT CT ABD-PELV W/ CM
2 of 5 series · 14 of 46 positions shown, 16 images · IV contrast (Omni 300)
Comparison: None.

CLINICAL DATA: Right lower quadrant abdominal pain. Evaluate for
appendicitis.

EXAM:
CT ABDOMEN AND PELVIS WITH CONTRAST
TECHNIQUE: Multidetector CT imaging of the abdomen and pelvis was performed
using the standard protocol following bolus administration of
intravenous contrast.
CONTRAST:  100mL OMNIPAQUE IOHEXOL 300 MG/ML  SOLN

[Series 3: a/p w/ 5mm · axial · 0.73mm/px · z∈[-480,-30]mm · 11 of 100 slices shown, 13 images]
[im 5/100  soft-tissue]
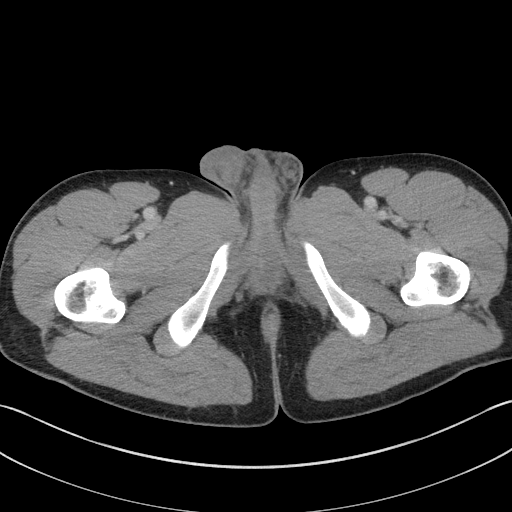
[im 5/100  bone]
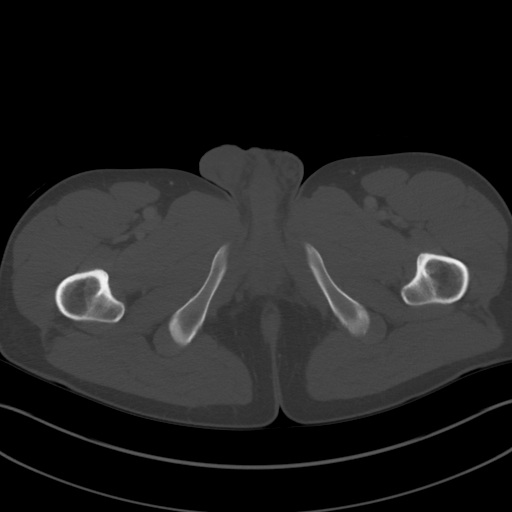
[im 15/100  soft-tissue]
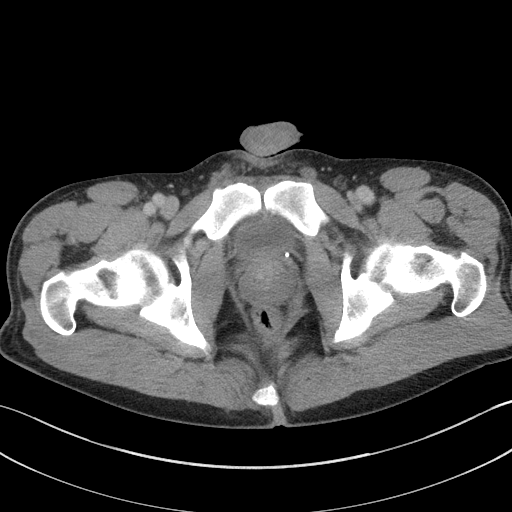
[im 25/100  soft-tissue]
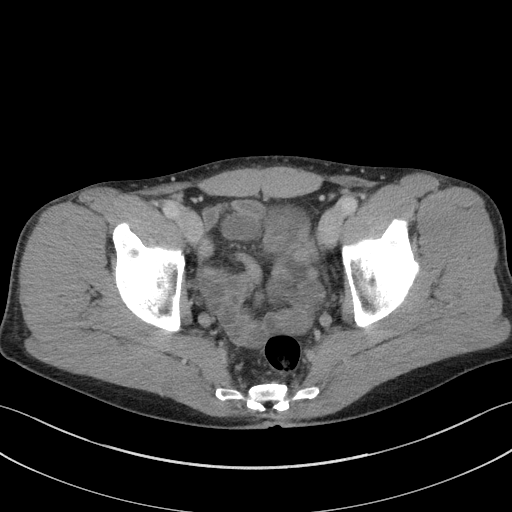
[im 35/100  soft-tissue]
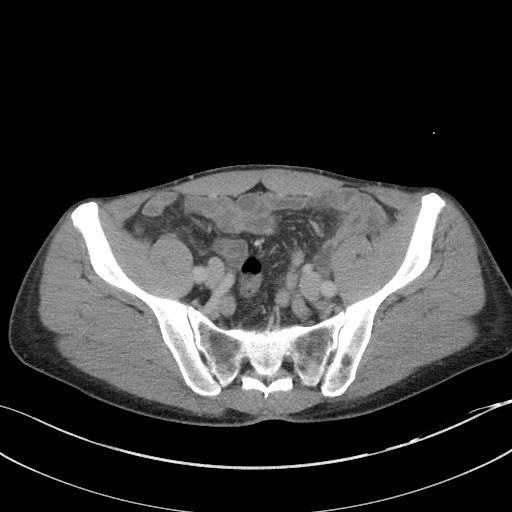
[im 40/100  soft-tissue]
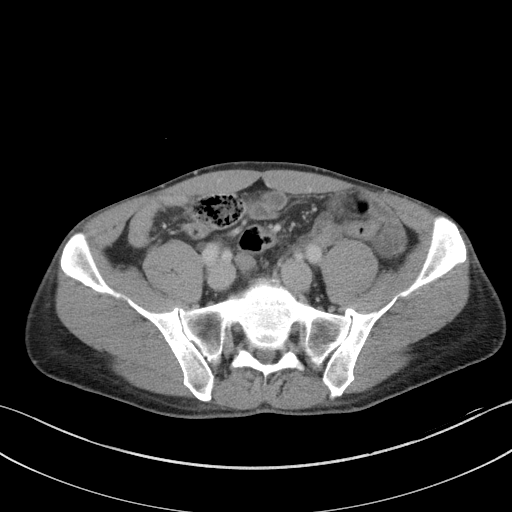
[im 50/100  soft-tissue]
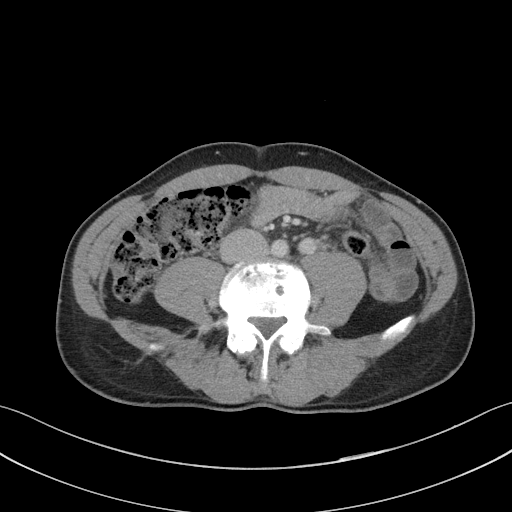
[im 60/100  soft-tissue]
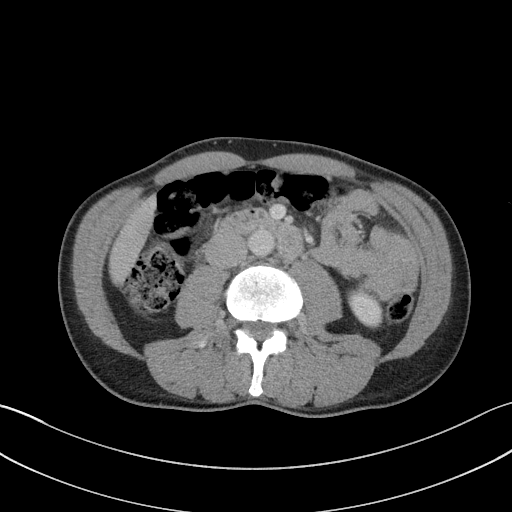
[im 65/100  soft-tissue]
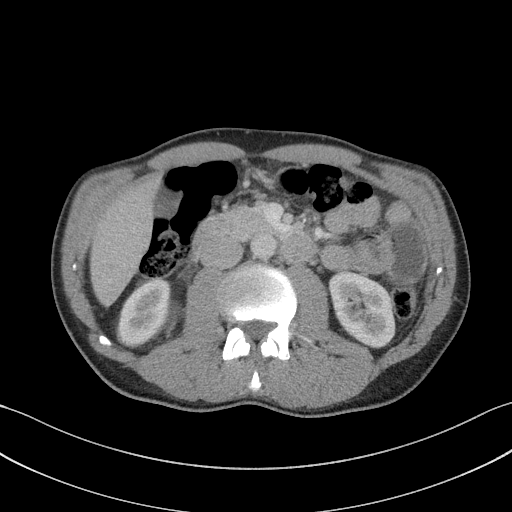
[im 75/100  soft-tissue]
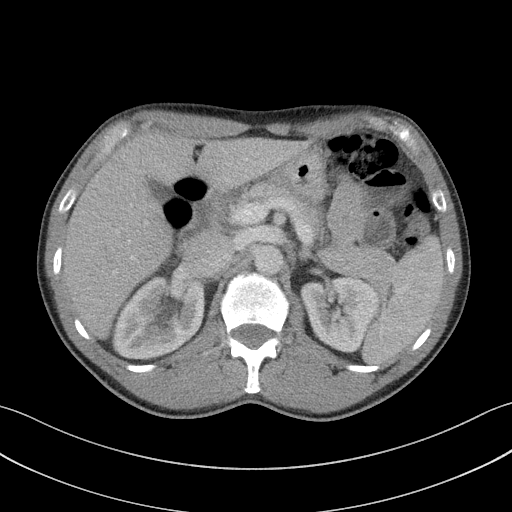
[im 75/100  bone]
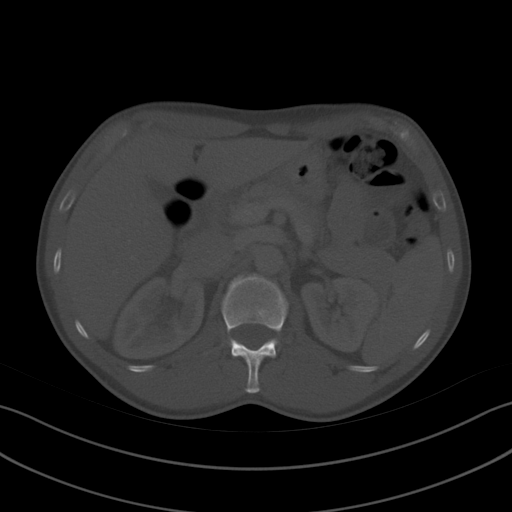
[im 85/100  soft-tissue]
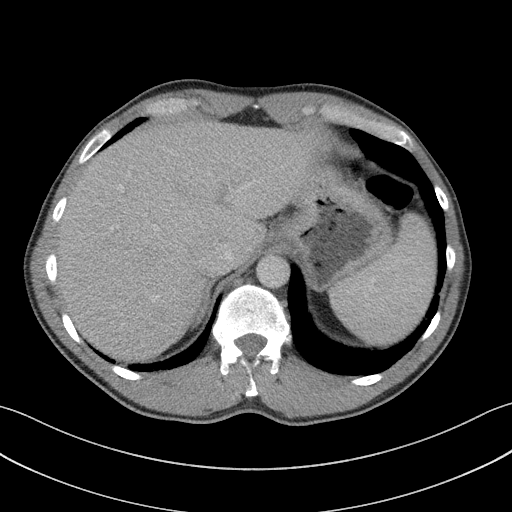
[im 95/100  soft-tissue]
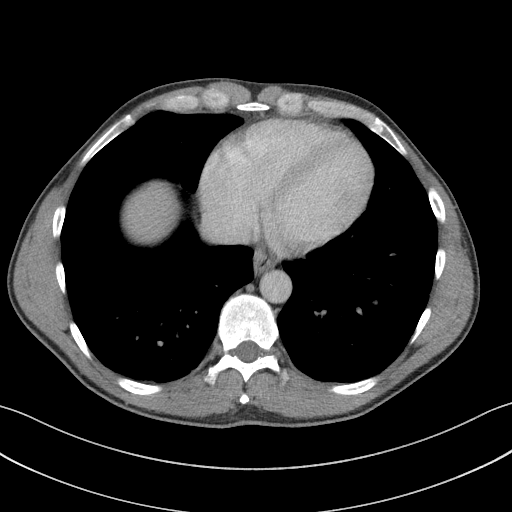

[Series 6: a/p w/ cor · coronal · 0.80mm/px · 3 of 134 slices shown]
[im 45/134  soft-tissue]
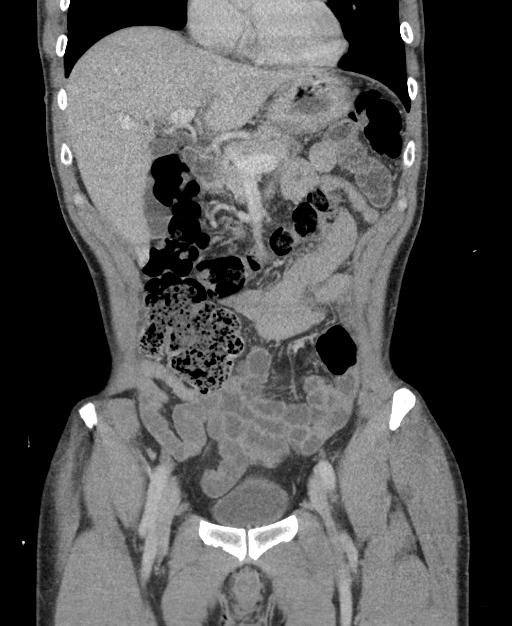
[im 60/134  soft-tissue]
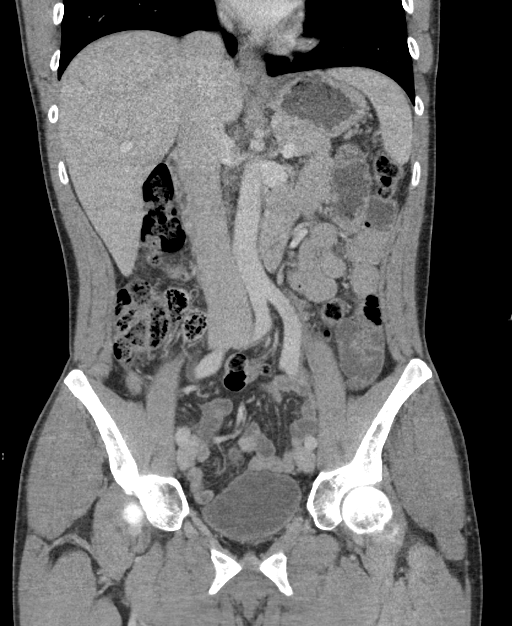
[im 74/134  soft-tissue]
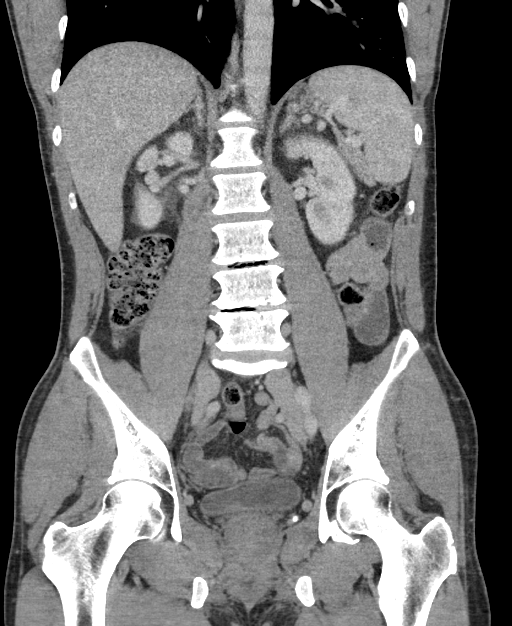

[14 of 46 positions shown; findings below may reference images not displayed]

FINDINGS: Lower chest: Limited visualization of the lower thorax demonstrates
an approximately 0.7 cm nodule within the left lower lobe (image 4,
series 4) which contains an internal calcification (coronal image
101, series 6).

There is an approximately 0.7 cm noncalcified nodule within the
right lower lobe (axial image 3, series 4; coronal image 69, series
6) as well as a punctate (approximately 4 mm) left lower lobe
pulmonary nodule (image 99, series 6) as well as a 3 mm pulmonary
nodule within the right lower lobe (image 1, series 4).

Minimal dependent subpleural ground-glass atelectasis. Minimal
subsegmental atelectasis within the left costophrenic angle. No
discrete consolidative opacities. No air bronchograms. No pleural
effusion.

Normal heart size.  No pericardial effusion.

Hepatobiliary: Normal hepatic contour. There is a minimal amount of
focal fatty infiltration adjacent to the fissure for ligamentum
teres. No discrete hepatic lesions. Normal appearance of the
gallbladder given degree distention. No radiopaque gallstones. No
intra or extrahepatic biliary ductal dilatation. No ascites.

Pancreas: Normal appearance of the pancreas.

Spleen: Normal appearance of the spleen.

Adrenals/Urinary Tract: There is a punctate (approximately 2 mm)
nonobstructing stone lying dependently within the urinary bladder
adjacent to the right UVJ (image 82, series 3; sagittal image 79,
series 7) with associated mild residual upstream right-sided
ureterectasis and pelvicaliectasis and thus favored to represent a
recently passed right-sided ureteral stone.

Note is made of a punctate (approximately 2 mm) nonobstructing stone
within the inferior pole the right kidney (coronal image 88, series
6. No evidence of left-sided nephrolithiasis on this postcontrast
examination. No evidence of left-sided urinary obstruction. Several
phleboliths are seen with the lower pelvis bilaterally, left greater
than right.

Punctate subcentimeter bilateral renal lesions are too small to
accurately characterize though favored to represent renal cysts.

Normal appearance the bilateral adrenal glands.

Otherwise, normal appearance of the urinary bladder given degree of
distention.

Stomach/Bowel: Moderate colonic stool burden without evidence of
enteric obstruction. Normal appearance of the terminal ileum. The
appendix is not visualized, however there is no pericecal
inflammatory change. No discrete areas of bowel wall thickening. No
pneumoperitoneum, pneumatosis or portal venous gas.

Vascular/Lymphatic: Normal caliber the abdominal aorta. The major
branch vessels of the abdominal aorta appear patent on this non CTA
examination.

No bulky retroperitoneal, mesenteric, pelvic or inguinal lymph
adenopathy.

Reproductive: Normal appearance the prostate gland. No free fluid
within the pelvic cul-de-sac.

Other: Regional soft tissues appear normal.

Musculoskeletal: No acute or aggressive osseous abnormalities.
Moderate to severe multilevel lumbar spine DDD, worse at L3-L4, how
L4-L5 and to a lesser extent, L5-S1 with disc space height loss,
endplate irregularity and sclerosis. Ex vacuo disc phenomenon is
noted at multiple levels.
IMPRESSION: 1. Punctate (approximately 2 mm) stone within the urinary bladder
adjacent to the right UVJ with residual upstream mild right-sided
ureterectasis and pelvicaliectasis, favored to represent a recently
passed right-sided ureteral stone.
2. Solitary additional punctate (approximately 2 mm) nonobstructing
right-sided renal stone.
3. No evidence of left sided nephrolithiasis or urinary obstruction.
4. Indeterminate pulmonary nodules within the imaged lung bases, the
largest of which measures 7 mm in diameter. Comparison with prior
outside examinations (if available), is advised. Otherwise, a
non-contrast chest CT at 3-6 months is recommended. If the nodules
are stable at time of repeat CT, then future CT at 18-24 months
(from today's scan) is considered optional for low-risk patients,
but is recommended for high-risk patients. This recommendation
follows the consensus statement: Guidelines for Management of
Incidental Pulmonary Nodules Detected on CT Images: From the

## 2020-06-13 DIAGNOSIS — Z13220 Encounter for screening for lipoid disorders: Secondary | ICD-10-CM | POA: Diagnosis not present

## 2020-06-13 DIAGNOSIS — Z125 Encounter for screening for malignant neoplasm of prostate: Secondary | ICD-10-CM | POA: Diagnosis not present

## 2020-06-13 DIAGNOSIS — Z0000 Encounter for general adult medical examination without abnormal findings: Secondary | ICD-10-CM | POA: Diagnosis not present

## 2020-06-13 DIAGNOSIS — Z23 Encounter for immunization: Secondary | ICD-10-CM | POA: Diagnosis not present

## 2020-06-13 DIAGNOSIS — K200 Eosinophilic esophagitis: Secondary | ICD-10-CM | POA: Diagnosis not present

## 2020-07-23 DIAGNOSIS — D1801 Hemangioma of skin and subcutaneous tissue: Secondary | ICD-10-CM | POA: Diagnosis not present

## 2020-07-23 DIAGNOSIS — D229 Melanocytic nevi, unspecified: Secondary | ICD-10-CM | POA: Diagnosis not present

## 2020-07-23 DIAGNOSIS — L821 Other seborrheic keratosis: Secondary | ICD-10-CM | POA: Diagnosis not present

## 2020-07-23 DIAGNOSIS — L814 Other melanin hyperpigmentation: Secondary | ICD-10-CM | POA: Diagnosis not present

## 2020-08-13 DIAGNOSIS — Z23 Encounter for immunization: Secondary | ICD-10-CM | POA: Diagnosis not present

## 2020-11-15 DIAGNOSIS — A09 Infectious gastroenteritis and colitis, unspecified: Secondary | ICD-10-CM | POA: Diagnosis not present

## 2020-12-04 DIAGNOSIS — H5203 Hypermetropia, bilateral: Secondary | ICD-10-CM | POA: Diagnosis not present

## 2020-12-04 DIAGNOSIS — H52223 Regular astigmatism, bilateral: Secondary | ICD-10-CM | POA: Diagnosis not present

## 2020-12-04 DIAGNOSIS — H524 Presbyopia: Secondary | ICD-10-CM | POA: Diagnosis not present

## 2020-12-04 DIAGNOSIS — H40013 Open angle with borderline findings, low risk, bilateral: Secondary | ICD-10-CM | POA: Diagnosis not present

## 2021-04-26 DIAGNOSIS — Z23 Encounter for immunization: Secondary | ICD-10-CM | POA: Diagnosis not present

## 2021-06-18 DIAGNOSIS — Z0000 Encounter for general adult medical examination without abnormal findings: Secondary | ICD-10-CM | POA: Diagnosis not present

## 2021-06-18 DIAGNOSIS — Z125 Encounter for screening for malignant neoplasm of prostate: Secondary | ICD-10-CM | POA: Diagnosis not present

## 2021-06-18 DIAGNOSIS — Z13220 Encounter for screening for lipoid disorders: Secondary | ICD-10-CM | POA: Diagnosis not present

## 2021-06-18 DIAGNOSIS — R140 Abdominal distension (gaseous): Secondary | ICD-10-CM | POA: Diagnosis not present

## 2021-07-23 DIAGNOSIS — L814 Other melanin hyperpigmentation: Secondary | ICD-10-CM | POA: Diagnosis not present

## 2021-07-23 DIAGNOSIS — D225 Melanocytic nevi of trunk: Secondary | ICD-10-CM | POA: Diagnosis not present

## 2021-07-23 DIAGNOSIS — D2261 Melanocytic nevi of right upper limb, including shoulder: Secondary | ICD-10-CM | POA: Diagnosis not present

## 2021-07-23 DIAGNOSIS — L821 Other seborrheic keratosis: Secondary | ICD-10-CM | POA: Diagnosis not present

## 2022-01-06 DIAGNOSIS — H52223 Regular astigmatism, bilateral: Secondary | ICD-10-CM | POA: Diagnosis not present

## 2022-01-06 DIAGNOSIS — H5203 Hypermetropia, bilateral: Secondary | ICD-10-CM | POA: Diagnosis not present

## 2022-01-06 DIAGNOSIS — H524 Presbyopia: Secondary | ICD-10-CM | POA: Diagnosis not present

## 2022-06-25 DIAGNOSIS — Z0000 Encounter for general adult medical examination without abnormal findings: Secondary | ICD-10-CM | POA: Diagnosis not present

## 2022-06-25 DIAGNOSIS — Z13220 Encounter for screening for lipoid disorders: Secondary | ICD-10-CM | POA: Diagnosis not present

## 2022-06-25 DIAGNOSIS — Z130 Encounter for screening for diseases of the blood and blood-forming organs and certain disorders involving the immune mechanism: Secondary | ICD-10-CM | POA: Diagnosis not present

## 2022-06-25 DIAGNOSIS — Z125 Encounter for screening for malignant neoplasm of prostate: Secondary | ICD-10-CM | POA: Diagnosis not present

## 2022-06-25 DIAGNOSIS — Z131 Encounter for screening for diabetes mellitus: Secondary | ICD-10-CM | POA: Diagnosis not present

## 2022-07-23 DIAGNOSIS — L538 Other specified erythematous conditions: Secondary | ICD-10-CM | POA: Diagnosis not present

## 2022-07-23 DIAGNOSIS — D225 Melanocytic nevi of trunk: Secondary | ICD-10-CM | POA: Diagnosis not present

## 2022-07-23 DIAGNOSIS — L821 Other seborrheic keratosis: Secondary | ICD-10-CM | POA: Diagnosis not present

## 2022-07-23 DIAGNOSIS — L298 Other pruritus: Secondary | ICD-10-CM | POA: Diagnosis not present

## 2022-07-23 DIAGNOSIS — L989 Disorder of the skin and subcutaneous tissue, unspecified: Secondary | ICD-10-CM | POA: Diagnosis not present

## 2022-07-23 DIAGNOSIS — Z872 Personal history of diseases of the skin and subcutaneous tissue: Secondary | ICD-10-CM | POA: Diagnosis not present

## 2022-07-23 DIAGNOSIS — D492 Neoplasm of unspecified behavior of bone, soft tissue, and skin: Secondary | ICD-10-CM | POA: Diagnosis not present

## 2022-07-23 DIAGNOSIS — L814 Other melanin hyperpigmentation: Secondary | ICD-10-CM | POA: Diagnosis not present

## 2022-08-18 DIAGNOSIS — C44329 Squamous cell carcinoma of skin of other parts of face: Secondary | ICD-10-CM | POA: Diagnosis not present

## 2022-08-18 DIAGNOSIS — L905 Scar conditions and fibrosis of skin: Secondary | ICD-10-CM | POA: Diagnosis not present

## 2022-08-19 DIAGNOSIS — R221 Localized swelling, mass and lump, neck: Secondary | ICD-10-CM | POA: Diagnosis not present

## 2022-08-28 DIAGNOSIS — K5730 Diverticulosis of large intestine without perforation or abscess without bleeding: Secondary | ICD-10-CM | POA: Diagnosis not present

## 2022-08-28 DIAGNOSIS — K648 Other hemorrhoids: Secondary | ICD-10-CM | POA: Diagnosis not present

## 2022-08-28 DIAGNOSIS — Z1211 Encounter for screening for malignant neoplasm of colon: Secondary | ICD-10-CM | POA: Diagnosis not present

## 2022-08-28 DIAGNOSIS — Z83719 Family history of colon polyps, unspecified: Secondary | ICD-10-CM | POA: Diagnosis not present

## 2022-09-15 ENCOUNTER — Other Ambulatory Visit (HOSPITAL_COMMUNITY): Payer: BC Managed Care – PPO

## 2022-09-22 ENCOUNTER — Ambulatory Visit: Admit: 2022-09-22 | Payer: BC Managed Care – PPO | Admitting: Otolaryngology

## 2022-09-22 SURGERY — EXCISION, PAROTID GLAND
Anesthesia: General | Laterality: Right

## 2022-09-30 DIAGNOSIS — H9313 Tinnitus, bilateral: Secondary | ICD-10-CM | POA: Diagnosis not present

## 2022-09-30 DIAGNOSIS — R221 Localized swelling, mass and lump, neck: Secondary | ICD-10-CM | POA: Diagnosis not present

## 2023-02-16 DIAGNOSIS — L814 Other melanin hyperpigmentation: Secondary | ICD-10-CM | POA: Diagnosis not present

## 2023-02-16 DIAGNOSIS — L821 Other seborrheic keratosis: Secondary | ICD-10-CM | POA: Diagnosis not present

## 2023-02-16 DIAGNOSIS — D225 Melanocytic nevi of trunk: Secondary | ICD-10-CM | POA: Diagnosis not present

## 2023-02-17 DIAGNOSIS — M25571 Pain in right ankle and joints of right foot: Secondary | ICD-10-CM | POA: Diagnosis not present

## 2023-03-09 DIAGNOSIS — H5203 Hypermetropia, bilateral: Secondary | ICD-10-CM | POA: Diagnosis not present

## 2023-03-09 DIAGNOSIS — H524 Presbyopia: Secondary | ICD-10-CM | POA: Diagnosis not present

## 2023-03-09 DIAGNOSIS — H52223 Regular astigmatism, bilateral: Secondary | ICD-10-CM | POA: Diagnosis not present

## 2023-06-29 DIAGNOSIS — Z0000 Encounter for general adult medical examination without abnormal findings: Secondary | ICD-10-CM | POA: Diagnosis not present

## 2023-06-29 DIAGNOSIS — Z1329 Encounter for screening for other suspected endocrine disorder: Secondary | ICD-10-CM | POA: Diagnosis not present

## 2023-06-29 DIAGNOSIS — Z23 Encounter for immunization: Secondary | ICD-10-CM | POA: Diagnosis not present

## 2023-06-29 DIAGNOSIS — G589 Mononeuropathy, unspecified: Secondary | ICD-10-CM | POA: Diagnosis not present

## 2023-06-29 DIAGNOSIS — Z13228 Encounter for screening for other metabolic disorders: Secondary | ICD-10-CM | POA: Diagnosis not present

## 2023-06-29 DIAGNOSIS — Z125 Encounter for screening for malignant neoplasm of prostate: Secondary | ICD-10-CM | POA: Diagnosis not present

## 2023-06-29 DIAGNOSIS — Z13220 Encounter for screening for lipoid disorders: Secondary | ICD-10-CM | POA: Diagnosis not present

## 2023-06-29 DIAGNOSIS — Z131 Encounter for screening for diabetes mellitus: Secondary | ICD-10-CM | POA: Diagnosis not present

## 2023-07-27 DIAGNOSIS — L821 Other seborrheic keratosis: Secondary | ICD-10-CM | POA: Diagnosis not present

## 2023-07-27 DIAGNOSIS — L918 Other hypertrophic disorders of the skin: Secondary | ICD-10-CM | POA: Diagnosis not present

## 2023-07-27 DIAGNOSIS — D225 Melanocytic nevi of trunk: Secondary | ICD-10-CM | POA: Diagnosis not present

## 2023-07-27 DIAGNOSIS — L814 Other melanin hyperpigmentation: Secondary | ICD-10-CM | POA: Diagnosis not present

## 2023-07-27 DIAGNOSIS — D492 Neoplasm of unspecified behavior of bone, soft tissue, and skin: Secondary | ICD-10-CM | POA: Diagnosis not present

## 2023-07-27 DIAGNOSIS — L820 Inflamed seborrheic keratosis: Secondary | ICD-10-CM | POA: Diagnosis not present

## 2024-07-04 DIAGNOSIS — Z1159 Encounter for screening for other viral diseases: Secondary | ICD-10-CM | POA: Diagnosis not present

## 2024-07-04 DIAGNOSIS — Z131 Encounter for screening for diabetes mellitus: Secondary | ICD-10-CM | POA: Diagnosis not present

## 2024-07-04 DIAGNOSIS — Z0000 Encounter for general adult medical examination without abnormal findings: Secondary | ICD-10-CM | POA: Diagnosis not present

## 2024-07-07 DIAGNOSIS — Z23 Encounter for immunization: Secondary | ICD-10-CM | POA: Diagnosis not present

## 2024-08-01 DIAGNOSIS — C4441 Basal cell carcinoma of skin of scalp and neck: Secondary | ICD-10-CM | POA: Diagnosis not present

## 2024-08-01 DIAGNOSIS — L821 Other seborrheic keratosis: Secondary | ICD-10-CM | POA: Diagnosis not present

## 2024-08-01 DIAGNOSIS — D492 Neoplasm of unspecified behavior of bone, soft tissue, and skin: Secondary | ICD-10-CM | POA: Diagnosis not present

## 2024-08-01 DIAGNOSIS — D225 Melanocytic nevi of trunk: Secondary | ICD-10-CM | POA: Diagnosis not present

## 2024-08-01 DIAGNOSIS — L814 Other melanin hyperpigmentation: Secondary | ICD-10-CM | POA: Diagnosis not present
